# Patient Record
Sex: Female | Born: 1980 | Smoking: Never smoker
Health system: Southern US, Community
[De-identification: ages and names within clinical notes are randomized; demographics above are authoritative.]

## PROBLEM LIST (undated history)

## (undated) DIAGNOSIS — Z803 Family history of malignant neoplasm of breast: Secondary | ICD-10-CM

## (undated) DIAGNOSIS — Z800 Family history of malignant neoplasm of digestive organs: Secondary | ICD-10-CM

## (undated) HISTORY — DX: Family history of malignant neoplasm of digestive organs: Z80.0

## (undated) HISTORY — DX: Family history of malignant neoplasm of breast: Z80.3

---

## 2011-08-04 ENCOUNTER — Other Ambulatory Visit (HOSPITAL_COMMUNITY)
Admission: RE | Admit: 2011-08-04 | Discharge: 2011-08-04 | Disposition: A | Discharge status: Home / Self Care | Payer: BC Managed Care – PPO | Source: Ambulatory Visit | Attending: Obstetrics and Gynecology | Admitting: Obstetrics and Gynecology

## 2011-08-04 DIAGNOSIS — Z01419 Encounter for gynecological examination (general) (routine) without abnormal findings: Secondary | ICD-10-CM | POA: Insufficient documentation

## 2012-08-07 ENCOUNTER — Other Ambulatory Visit (HOSPITAL_COMMUNITY)
Admission: RE | Admit: 2012-08-07 | Discharge: 2012-08-07 | Disposition: A | Discharge status: Home / Self Care | Payer: BC Managed Care – PPO | Source: Ambulatory Visit | Attending: Obstetrics and Gynecology | Admitting: Obstetrics and Gynecology

## 2012-08-07 ENCOUNTER — Other Ambulatory Visit: Payer: Self-pay | Admitting: Obstetrics and Gynecology

## 2012-08-07 DIAGNOSIS — Z01419 Encounter for gynecological examination (general) (routine) without abnormal findings: Secondary | ICD-10-CM | POA: Insufficient documentation

## 2012-08-07 DIAGNOSIS — Z1151 Encounter for screening for human papillomavirus (HPV): Secondary | ICD-10-CM | POA: Insufficient documentation

## 2012-08-16 ENCOUNTER — Emergency Department (INDEPENDENT_AMBULATORY_CARE_PROVIDER_SITE_OTHER)
Admission: EM | Admit: 2012-08-16 | Discharge: 2012-08-16 | Disposition: A | Discharge status: Home / Self Care | Payer: Worker's Compensation | Source: Home / Self Care | Attending: Family Medicine | Admitting: Family Medicine

## 2012-08-16 ENCOUNTER — Encounter (HOSPITAL_COMMUNITY): Payer: Self-pay

## 2012-08-16 DIAGNOSIS — R55 Syncope and collapse: Secondary | ICD-10-CM

## 2012-08-16 LAB — GLUCOSE, CAPILLARY: Glucose-Capillary: 110 mg/dL — ABNORMAL HIGH (ref 70–99)

## 2012-08-16 LAB — POCT I-STAT, CHEM 8
Chloride: 105 mEq/L (ref 96–112)
Creatinine, Ser: 1 mg/dL (ref 0.50–1.10)
Glucose, Bld: 107 mg/dL — ABNORMAL HIGH (ref 70–99)
HCT: 42 % (ref 36.0–46.0)
Hemoglobin: 14.3 g/dL (ref 12.0–15.0)
Potassium: 4.2 mEq/L (ref 3.5–5.1)
Sodium: 141 mEq/L (ref 135–145)

## 2012-08-16 LAB — POCT PREGNANCY, URINE: Preg Test, Ur: NEGATIVE

## 2012-08-16 MED ORDER — MECLIZINE HCL 50 MG PO TABS: 25.0000 mg | ORAL_TABLET | Freq: Three times a day (TID) | ORAL | Status: DC | PRN

## 2012-08-16 NOTE — ED Provider Notes (Signed)
CSN: [id number]     Arrival date & time 08/16/12  1429 History     First MD Initiated Contact with Patient 08/16/12 1459     Chief Complaint  Patient presents with  . Dizziness   (Consider location/radiation/quality/duration/timing/severity/associated sxs/prior Treatment) HPI Comments: 32 year old female with  history of vertigo episodes in the past. Here complaining of an episodes of dizziness associated with nausea vomiting earlier today. 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 He states that she felt hot and dizzy and had to be out of the house and in her car she started feeling nauseous and vomiting. Denies chest pain, syncope or chest pain or palpitations. He states that she's had similar symptoms in the past but associated with sensation of room spinning she did not have this time. She had one episode of loose stools here. Denies abdominal pain or chest pain. When she got here her nausea, dizziness has resolved and she reports feeling well currently. She has had breakfast and lunch today. No history of diabetes or heart disease.    History reviewed. No pertinent past medical history. History reviewed. No pertinent past surgical history. History reviewed. No pertinent family history. History  Substance Use Topics  . Smoking status: Never Smoker   . Smokeless tobacco: Not on file  . Alcohol Use: Yes   OB History   Grav Para Term Preterm Abortions TAB SAB Ect Mult Living                 Review of Systems  Constitutional: Negative for fever, chills, appetite change and fatigue.  HENT: Negative for ear pain, congestion, sore throat and tinnitus.   Eyes: Negative for visual disturbance.  Gastrointestinal: Positive for nausea and vomiting. Negative for abdominal pain.  Genitourinary: Negative for dysuria, flank pain and pelvic pain.  Skin: Negative for rash.  Neurological:  Positive for dizziness. Negative for tremors, seizures, syncope, weakness, numbness and headaches.  All other systems reviewed and are negative.    Allergies  Review of patient's allergies indicates no known allergies.  Home Medications   Current Outpatient Rx  Name  Route  Sig  Dispense  Refill  . meclizine (ANTIVERT) 50 MG tablet   Oral   Take 0.5 tablets (25 mg total) by mouth 3 (three) times daily as needed for dizziness or nausea.   30 tablet   0    BP 112/77  Pulse 89  Temp(Src) 98.6 F (37 C) (Oral)  Resp 16  LMP 08/16/2012 Physical Exam  Nursing note and vitals reviewed. Constitutional: She is oriented to person, place, and time. She appears well-developed and well-nourished. No distress.  HENT:  Head: Normocephalic and atraumatic.  Right Ear: External ear normal.  Left Ear: External ear normal.  Mouth/Throat: Oropharynx is clear and moist. No oropharyngeal exudate.  Eyes: Conjunctivae and EOM are normal. Pupils are equal, round, and reactive to light. No scleral icterus.  No nystagmus  Neck: Normal range of motion. Neck supple. No JVD present. No thyromegaly present.  Cardiovascular: Normal rate, regular rhythm, normal heart sounds and intact distal pulses.  Exam reveals no gallop and no friction rub.   No murmur heard. Pulmonary/Chest: Effort normal and breath sounds normal. No respiratory distress. She has no wheezes. She has no rales. She exhibits no tenderness.  Abdominal: Soft. Bowel sounds are normal. She exhibits no distension and no mass. There is no tenderness.  There is no rebound and no guarding.  Lymphadenopathy:    She has no cervical adenopathy.  Neurological: She is alert and oriented to person, place, and time. She has normal strength. No cranial nerve deficit or sensory deficit. She displays a negative Romberg sign. Coordination and gait normal.  No nystagmus with Dix Hallpike maneuver.   Skin: No rash noted. She is not diaphoretic.    ED Course    Procedures (including critical care time)  Labs Reviewed  GLUCOSE, CAPILLARY - Abnormal; Notable for the following:    Glucose-Capillary 110 (*)    All other components within normal limits  POCT I-STAT, CHEM 8 - Abnormal; Notable for the following:    Glucose, Bld 107 (*)    Calcium, Ion 1.25 (*)    All other components within normal limits  POCT PREGNANCY, URINE   No results found. 1. Vaso vagal episode    EKG: Normal sinus rhythm with ventricular rate of 81 beats per minutes no ST or other acute ischemic changes. Normal axis.  No fascicular or bundle branch blocks. Your nonspecific T wave flattening with no inversion in aVL V2 and V3. No prior EKG for comparison.   MDM  Not orthostatic. No signs of dehydration. Reassuring exam. Asymptomatic here impress had a vasovagal episode vs vertigo.  Prescribed meclizine. Supportive care and red flags that should prompt return to medical attention discussed with patient and provided in writing.   Sharin Grave, MD 08/18/12 2112

## 2012-08-16 NOTE — ED Notes (Signed)
Employee of [employer] ; wears a bullet proof vest under shirt , and while at client home , states she got really hot, and felt she would pass out. Vomited several times. Denies pain, SOB, nausea at present

## 2013-08-08 ENCOUNTER — Other Ambulatory Visit (HOSPITAL_COMMUNITY)
Admission: RE | Admit: 2013-08-08 | Discharge: 2013-08-08 | Disposition: A | Discharge status: Home / Self Care | Payer: BC Managed Care – PPO | Source: Ambulatory Visit | Attending: Obstetrics and Gynecology | Admitting: Obstetrics and Gynecology

## 2013-08-08 ENCOUNTER — Other Ambulatory Visit: Payer: Self-pay | Admitting: Obstetrics and Gynecology

## 2013-08-08 DIAGNOSIS — Z01419 Encounter for gynecological examination (general) (routine) without abnormal findings: Secondary | ICD-10-CM | POA: Insufficient documentation

## 2013-08-09 LAB — CYTOLOGY - PAP

## 2019-01-16 DIAGNOSIS — Z20828 Contact with and (suspected) exposure to other viral communicable diseases: Secondary | ICD-10-CM | POA: Diagnosis not present

## 2019-01-16 DIAGNOSIS — Z6822 Body mass index (BMI) 22.0-22.9, adult: Secondary | ICD-10-CM | POA: Diagnosis not present

## 2019-02-28 ENCOUNTER — Other Ambulatory Visit: Payer: Self-pay

## 2019-02-28 ENCOUNTER — Ambulatory Visit (INDEPENDENT_AMBULATORY_CARE_PROVIDER_SITE_OTHER): Payer: Federal, State, Local not specified - PPO | Admitting: Advanced Practice Midwife

## 2019-02-28 VITALS — BP 110/76 | HR 92 | Ht 66.0 in | Wt 148.0 lb

## 2019-02-28 DIAGNOSIS — Z3169 Encounter for other general counseling and advice on procreation: Secondary | ICD-10-CM

## 2019-02-28 DIAGNOSIS — Z124 Encounter for screening for malignant neoplasm of cervix: Secondary | ICD-10-CM

## 2019-02-28 DIAGNOSIS — N939 Abnormal uterine and vaginal bleeding, unspecified: Secondary | ICD-10-CM

## 2019-02-28 DIAGNOSIS — Z01419 Encounter for gynecological examination (general) (routine) without abnormal findings: Secondary | ICD-10-CM | POA: Diagnosis not present

## 2019-02-28 DIAGNOSIS — Z0000 Encounter for general adult medical examination without abnormal findings: Secondary | ICD-10-CM

## 2019-02-28 MED ORDER — PRENATE MINI 18-0.6-0.4-350 MG PO CAPS: 1.0000 | ORAL_CAPSULE | Freq: Every day | ORAL | 11 refills | Status: DC

## 2019-02-28 NOTE — Progress Notes (Signed)
Subjective:     Mandy Hill is a 39 y.o. female here at [facility] for a routine exam.  Current complaints: Irregular spotting last month.  Personal health questionnaire reviewed: yes.  First degree relative with breast cancer? Yes Do you have a primary care provider? Yes  Depression screen PHQ 2/9 02/28/2019  Decreased Interest 0  Down, Depressed, Hopeless 0  PHQ - 2 Score 0     Gynecologic History No LMP recorded. Contraception: none Last Pap: 2019. Results were: normal Last mammogram: n/a.   Obstetric History OB History  No obstetric history on file.     The following portions of the patient's history were reviewed and updated as appropriate: allergies, current medications, past family history, past medical history, past social history, past surgical history and problem list.  Review of Systems Pertinent items noted in HPI and remainder of comprehensive ROS otherwise negative.    Objective:   BP 110/76   Pulse 92   Ht 5\' 6"  (1.676 m)   Wt 148 lb (67.1 kg)   BMI 23.89 kg/m    VS reviewed, nursing note reviewed,  Constitutional: well developed, well nourished, no distress HEENT: normocephalic, thyroid without enlargement or mass CV: normal rate HEART: normal rate, heart sounds, regular rhythm RESP: normal effort, lung sounds clear and equal bilaterally Breast Exam:  right breast normal without mass, skin or nipple changes or axillary nodes, left breast normal without mass, skin or nipple changes or axillary nodes Abdomen: soft Neuro: alert and oriented x 3 Skin: warm, dry Psych: affect normal Pelvic exam: Deferred     Assessment/Plan:   1. Screening for cervical cancer --Pt had normal Pap in 2019. Discussed options, including Pap today or Pap in 2024 when due.  Will wait and defer Pap today.  2. Encounter for preconception consultation --Pt and her husband desire pregnancy. She has never tried to become pregnant before until this month. She has regular menses  but this month had intermenstrual spotting, was hoping it was implantation spotting but pregnancy tests were negative. --Discussed relative risks of AMA pregnancy, fertility options if pt still trying in 6 months. --Questions answered --Rx for PNV, take daily or take folic acid daily   3. Well woman exam (no gynecological exam)   4. Abnormal uterine bleeding (AUB) --Single episode of metrorrhagia. May have been implantation spotting but pregnancy not achieved or other physiologic cause.  No need for treatment at this time, will follow up if persists.    Follow up in: 6 months or as needed.   2025, CNM 2:43 PM

## 2019-02-28 NOTE — Patient Instructions (Signed)
Preventive Care 21-39 Years Old, Female Preventive care refers to visits with your health care provider and lifestyle choices that can promote health and wellness. This includes:  A yearly physical exam. This may also be called an annual well check.  Regular dental visits and eye exams.  Immunizations.  Screening for certain conditions.  Healthy lifestyle choices, such as eating a healthy diet, getting regular exercise, not using drugs or products that contain nicotine and tobacco, and limiting alcohol use. What can I expect for my preventive care visit? Physical exam Your health care provider will check your:  Height and weight. This may be used to calculate body mass index (BMI), which tells if you are at a healthy weight.  Heart rate and blood pressure.  Skin for abnormal spots. Counseling Your health care provider may ask you questions about your:  Alcohol, tobacco, and drug use.  Emotional well-being.  Home and relationship well-being.  Sexual activity.  Eating habits.  Work and work environment.  Method of birth control.  Menstrual cycle.  Pregnancy history. What immunizations do I need?  Influenza (flu) vaccine  This is recommended every year. Tetanus, diphtheria, and pertussis (Tdap) vaccine  You may need a Td booster every 10 years. Varicella (chickenpox) vaccine  You may need this if you have not been vaccinated. Human papillomavirus (HPV) vaccine  If recommended by your health care provider, you may need three doses over 6 months. Measles, mumps, and rubella (MMR) vaccine  You may need at least one dose of MMR. You may also need a second dose. Meningococcal conjugate (MenACWY) vaccine  One dose is recommended if you are age 19-21 years and a first-year college student living in a residence hall, or if you have one of several medical conditions. You may also need additional booster doses. Pneumococcal conjugate (PCV13) vaccine  You may need  this if you have certain conditions and were not previously vaccinated. Pneumococcal polysaccharide (PPSV23) vaccine  You may need one or two doses if you smoke cigarettes or if you have certain conditions. Hepatitis A vaccine  You may need this if you have certain conditions or if you travel or work in places where you may be exposed to hepatitis A. Hepatitis B vaccine  You may need this if you have certain conditions or if you travel or work in places where you may be exposed to hepatitis B. Haemophilus influenzae type b (Hib) vaccine  You may need this if you have certain conditions. You may receive vaccines as individual doses or as more than one vaccine together in one shot (combination vaccines). Talk with your health care provider about the risks and benefits of combination vaccines. What tests do I need?  Blood tests  Lipid and cholesterol levels. These may be checked every 5 years starting at age 20.  Hepatitis C test.  Hepatitis B test. Screening  Diabetes screening. This is done by checking your blood sugar (glucose) after you have not eaten for a while (fasting).  Sexually transmitted disease (STD) testing.  BRCA-related cancer screening. This may be done if you have a family history of breast, ovarian, tubal, or peritoneal cancers.  Pelvic exam and Pap test. This may be done every 3 years starting at age 21. Starting at age 30, this may be done every 5 years if you have a Pap test in combination with an HPV test. Talk with your health care provider about your test results, treatment options, and if necessary, the need for more tests.   Follow these instructions at home: Eating and drinking   Eat a diet that includes fresh fruits and vegetables, whole grains, lean protein, and low-fat dairy.  Take vitamin and mineral supplements as recommended by your health care provider.  Do not drink alcohol if: ? Your health care provider tells you not to drink. ? You are  pregnant, may be pregnant, or are planning to become pregnant.  If you drink alcohol: ? Limit how much you have to 0-1 drink a day. ? Be aware of how much alcohol is in your drink. In the [place] one drink equals one 12 oz bottle of beer (355 mL), one 5 oz glass of wine (148 mL), or one 1 oz glass of hard liquor (44 mL). Lifestyle  Take daily care of your teeth and gums.  Stay active. Exercise for at least 30 minutes on 5 or more days each week.  Do not use any products that contain nicotine or tobacco, such as cigarettes, e-cigarettes, and chewing tobacco. If you need help quitting, ask your health care provider.  If you are sexually active, practice safe sex. Use a condom or other form of birth control (contraception) in order to prevent pregnancy and STIs (sexually transmitted infections). If you plan to become pregnant, see your health care provider for a preconception visit. What's next?  Visit your health care provider once a year for a well check visit.  Ask your health care provider how often you should have your eyes and teeth checked.  Stay up to date on all vaccines. This information is not intended to replace advice given to you by your health care provider. Make sure you discuss any questions you have with your health care provider. Document Revised: 09/01/2017 Document Reviewed: 09/01/2017 Elsevier Patient Education  2020 [employer].

## 2019-02-28 NOTE — Progress Notes (Signed)
NGYN patient presents for Annual Exam.  Last Pap: 2019 WNL no Hx of abnormal pap.  LMP:02/07/2019 cycles last 3-4 days last cycle was different per pt.  Contraception: None  STD Screening: Declined  Family Hx of Breast Cancer: Yes Mother Dx in 2006.  CC: Pt wants to conceive pt would like prenatal vitamins.

## 2019-09-05 DIAGNOSIS — R110 Nausea: Secondary | ICD-10-CM | POA: Diagnosis not present

## 2019-09-05 DIAGNOSIS — R197 Diarrhea, unspecified: Secondary | ICD-10-CM | POA: Diagnosis not present

## 2019-11-23 DIAGNOSIS — D239 Other benign neoplasm of skin, unspecified: Secondary | ICD-10-CM | POA: Diagnosis not present

## 2019-11-23 DIAGNOSIS — L408 Other psoriasis: Secondary | ICD-10-CM | POA: Diagnosis not present

## 2019-11-23 DIAGNOSIS — L700 Acne vulgaris: Secondary | ICD-10-CM | POA: Diagnosis not present

## 2020-03-10 DIAGNOSIS — Z0000 Encounter for general adult medical examination without abnormal findings: Secondary | ICD-10-CM | POA: Diagnosis not present

## 2020-03-10 DIAGNOSIS — Z13220 Encounter for screening for lipoid disorders: Secondary | ICD-10-CM | POA: Diagnosis not present

## 2020-03-19 DIAGNOSIS — M545 Low back pain, unspecified: Secondary | ICD-10-CM | POA: Diagnosis not present

## 2020-04-01 DIAGNOSIS — K5900 Constipation, unspecified: Secondary | ICD-10-CM | POA: Diagnosis not present

## 2020-04-01 DIAGNOSIS — Z800 Family history of malignant neoplasm of digestive organs: Secondary | ICD-10-CM | POA: Diagnosis not present

## 2020-04-01 DIAGNOSIS — K648 Other hemorrhoids: Secondary | ICD-10-CM | POA: Diagnosis not present

## 2020-04-23 DIAGNOSIS — M9902 Segmental and somatic dysfunction of thoracic region: Secondary | ICD-10-CM | POA: Diagnosis not present

## 2020-04-23 DIAGNOSIS — M9903 Segmental and somatic dysfunction of lumbar region: Secondary | ICD-10-CM | POA: Diagnosis not present

## 2020-04-23 DIAGNOSIS — M9905 Segmental and somatic dysfunction of pelvic region: Secondary | ICD-10-CM | POA: Diagnosis not present

## 2020-04-23 DIAGNOSIS — M9901 Segmental and somatic dysfunction of cervical region: Secondary | ICD-10-CM | POA: Diagnosis not present

## 2020-06-10 DIAGNOSIS — R87612 Low grade squamous intraepithelial lesion on cytologic smear of cervix (LGSIL): Secondary | ICD-10-CM | POA: Diagnosis not present

## 2020-06-10 DIAGNOSIS — Z01419 Encounter for gynecological examination (general) (routine) without abnormal findings: Secondary | ICD-10-CM | POA: Diagnosis not present

## 2020-06-16 ENCOUNTER — Telehealth: Payer: Self-pay | Admitting: Genetic Counselor

## 2020-06-16 NOTE — Telephone Encounter (Signed)
Pt called in to sch appt. Pt aware of updated appt.

## 2020-06-17 ENCOUNTER — Other Ambulatory Visit: Payer: Self-pay

## 2020-06-17 ENCOUNTER — Inpatient Hospital Stay: Payer: Federal, State, Local not specified - PPO

## 2020-06-17 ENCOUNTER — Inpatient Hospital Stay: Payer: Federal, State, Local not specified - PPO | Attending: Hematology | Admitting: Genetic Counselor

## 2020-06-17 DIAGNOSIS — Z1509 Genetic susceptibility to other malignant neoplasm: Secondary | ICD-10-CM

## 2020-06-17 DIAGNOSIS — Z803 Family history of malignant neoplasm of breast: Secondary | ICD-10-CM

## 2020-06-17 DIAGNOSIS — Z800 Family history of malignant neoplasm of digestive organs: Secondary | ICD-10-CM

## 2020-06-17 DIAGNOSIS — Z1501 Genetic susceptibility to malignant neoplasm of breast: Secondary | ICD-10-CM

## 2020-06-19 DIAGNOSIS — Z1211 Encounter for screening for malignant neoplasm of colon: Secondary | ICD-10-CM | POA: Diagnosis not present

## 2020-06-19 DIAGNOSIS — K635 Polyp of colon: Secondary | ICD-10-CM | POA: Diagnosis not present

## 2020-06-19 DIAGNOSIS — Z800 Family history of malignant neoplasm of digestive organs: Secondary | ICD-10-CM | POA: Diagnosis not present

## 2020-06-19 DIAGNOSIS — Q438 Other specified congenital malformations of intestine: Secondary | ICD-10-CM | POA: Diagnosis not present

## 2020-06-25 ENCOUNTER — Encounter: Payer: Self-pay | Admitting: Genetic Counselor

## 2020-06-25 ENCOUNTER — Other Ambulatory Visit: Payer: Self-pay | Admitting: Internal Medicine

## 2020-06-25 DIAGNOSIS — Z800 Family history of malignant neoplasm of digestive organs: Secondary | ICD-10-CM | POA: Insufficient documentation

## 2020-06-25 DIAGNOSIS — Z1231 Encounter for screening mammogram for malignant neoplasm of breast: Secondary | ICD-10-CM

## 2020-06-25 DIAGNOSIS — Z803 Family history of malignant neoplasm of breast: Secondary | ICD-10-CM | POA: Insufficient documentation

## 2020-06-25 NOTE — Progress Notes (Signed)
REFERRING PROVIDER: Ma Hillock, [place] [address] [place],  [place] [address]  PRIMARY PROVIDER:  Seward Carol, MD  PRIMARY REASON FOR VISIT:  1. Family history of breast cancer   2. Family history of colon cancer   3. Family history of throat cancer      HISTORY OF PRESENT ILLNESS:   Mandy Hill, a 40 y.o. female, was seen for a [place] cancer genetics consultation at the request of Burman Riis, NP, due to a family history of cancer.  Mandy Hill presents to clinic today to discuss the possibility of a hereditary predisposition to cancer, genetic testing, and to further clarify her future cancer risks, as well as potential cancer risks for family members.   Mandy Hill does not have a personal history of cancer.    RISK FACTORS:  Menarche was at age 59.  No live births.  OCP use for approximately 3 years.  Ovaries intact: yes.  Hysterectomy: no.  Menopausal status: premenopausal.  HRT use: 0 years. Colonoscopy: yes; planned 06/19/2020. Mammogram within the  last year: n/a. Number of breast biopsies: 0. Up to date with pelvic exams: yes. Any excessive radiation exposure in the past: no  Past Medical History:  Diagnosis Date   Family history of breast cancer    Family history of colon cancer    Family history of throat cancer     No past surgical history on file.  Social History   Socioeconomic History   Marital status: Unknown    Spouse name: Not on file   Number of children: Not on file   Years of education: Not on file   Highest education level: Not on file  Occupational History   Not on file  Tobacco Use   Smoking status: Never   Smokeless tobacco: Not on file  Substance and Sexual Activity   Alcohol use: Yes   Drug use: Not on file   Sexual activity: Yes    Birth control/protection: I.U.D.  Other Topics Concern   Not on file  Social History Narrative   Not on file   Social Determinants of Health   Financial Resource  Strain: Not on file  Food Insecurity: Not on file  Transportation Needs: Not on file  Physical Activity: Not on file  Stress: Not on file  Social Connections: Not on file     FAMILY HISTORY:  We obtained a detailed, 4-generation family history.  Significant diagnoses are listed below: Family History  Problem Relation Age of Onset   Breast cancer Mother 51   Colon cancer Father 3   Throat cancer Paternal Grandfather        dx 51s, hx of smoking   Breast cancer Other        dx 9s, paternal great-aunt   Breast cancer Other        dx 24s, paternal great-aunt   Throat cancer Other 40       paternal great-uncle   Throat cancer Other        dx 33s, paternal great-uncle   Breast cancer Other 70       paternal first cousin once removed   Mandy Hill does not have children. She has one sister (age 85) who has not had cancer.   Mandy Hill mother is alive at age 64 and has a history of breast cancer (diagnosed age 81). There were 12 maternal aunts and 3 maternal uncles. There is no known cancer among maternal aunts/uncles or maternal  cousins. Mandy Hill maternal grandmother died at age 26 without cancer. Her maternal grandfather died in his 45s without cancer.  Mandy Hill father is alive at age 88 and has a history of colon cancer (diagnosed age 6). There is one paternal uncle who has not had cancer. There is no known cancer among paternal first cousins. Mandy Hill paternal grandmother is alive at age 45 without cancer. Her paternal grandfather died at age 59 with throat cancer (diagnosed in his 92s) and had a history of smoking. Two paternal great-aunts had breast cancer, both diagnosed in their 60s. Two paternal great-uncles had throat cancer, one diagnosed at age 80 and the other diagnosed in his 7s. A paternal first cousin once removed had breast cancer diagnosed at age 102.  Mandy Hill is unaware of previous family history of genetic testing for hereditary cancer risks. Patient's  maternal ancestors are of Native American descent, and paternal ancestors are of unknown descent. There is no reported Ashkenazi Jewish ancestry. There is no known consanguinity.  GENETIC COUNSELING ASSESSMENT: Mandy Hill is a 40 y.o. female with a family history of breast cancer, colon cancer, and throat cancer which is not strongly suggestive of a hereditary cancer syndrome. We, therefore, discussed and recommended the following at today's visit.   DISCUSSION:  We discussed that, in general, most cancer is not inherited in families, but instead is sporadic or familial. Sporadic cancers occur by chance and typically happen at older ages (>50 years) as this type of cancer is caused by genetic changes acquired during an individual's lifetime. Some families have more cancers than would be expected by chance; however, the ages or types of cancer are not consistent with a known genetic mutation or known genetic mutations have been ruled out. This type of familial cancer is thought to be due to a combination of multiple genetic, environmental, hormonal, and lifestyle factors. While this combination of factors likely increases the risk of cancer, the exact source of this risk is not currently identifiable or testable.    We discussed that approximately 5-10% of cancer is hereditary, meaning that it is due to a mutation in a single gene that is passed down from generation to generation in a family. Most hereditary cases of breast cancer are associated with BRCA1 and BRCA2 genes, whereas most hereditary cases of colorectal cancer are associated with Lynch syndrome. There are other genes that can be associated an increased risk for breast and/or colorectal cancer, including ATM, CHEK2, PALB2, APC, MUTYH, etc. We discussed that testing can be beneficial for several reasons, including knowing about other cancer risks, identifying potential screening and risk-reduction options that may be appropriate, and to understand if  other family members could be at risk for cancer and allow them to undergo genetic testing.   We reviewed the characteristics, features and inheritance patterns of hereditary cancer syndromes. We discussed with Ms. Lampkins that the family history does not meet insurance or NCCN criteria for genetic testing and, therefore, is not highly consistent with a familial hereditary cancer syndrome.  We feel she is at low risk to harbor a gene mutation associated with such a condition. Thus, we did not recommend any genetic testing, at this time, and recommended Ms. Moren continue to follow the cancer screening guidelines given by her primary healthcare provider. We discussed that if Ms. Hogan still wishes to pursue genetic testing, the out of pocket cost would be $250.   Based on the patient's personal and family history, a statistical model (  Tyrer-Cuzick) was used to estimate her risk of developing breast cancer. Tyrer-Cuzick estimates her lifetime risk of developing breast cancer to be approximately 14.8%. This estimation does not consider any genetic testing results. The patient's lifetime breast cancer risk is a preliminary estimate based on available information using one of several models endorsed by the [facility] (ACS). The ACS recommends consideration of breast MRI screening as an adjunct to mammography for patients at high risk (defined as 20% or greater lifetime risk). We discussed that this risk estimate can change over time and that this calculation may be repeated to reflect new information in her personal or family history in the future.     PLAN: Genetic testing was not recommended at today's visit. We remain available to coordinate genetic testing at any time in the future. We, therefore, recommend Ms. Emanuele continue to follow the cancer screening guidelines given by her primary healthcare provider.  Based on Ms. Lansdowne's family history, we recommended her paternal first cousin once  removed, who was diagnosed with breast cancer at age 20, have genetic counseling and testing. Ms. Servidio will let us know if we can be of any assistance in coordinating genetic counseling and/or testing for this family member.   Lastly, we encouraged Ms. Culliton to remain in contact with cancer genetics annually so that we can continuously update the family history and inform her of any changes in cancer genetics and testing that may be of benefit for this family.   Ms. Esguerra questions were answered to her satisfaction today. Our contact information was provided should additional questions or concerns arise. Thank you for the referral and allowing [place] to share in the care of your patient.   Clint Guy, [place], [facility] Licensed, Certified Dispensing [email or web address] Phone: [phone]  The patient was seen for a total of 35 minutes in face-to-face genetic counseling. Patient was seen alone. This patient was discussed with Drs. Magrinat, Lindi Adie and/or Burr Medico who agrees with the above.    _______________________________________________________________________ For Office Staff:  Number of people involved in session: 1 Was an Intern/ student involved with case: no

## 2020-07-18 DIAGNOSIS — Z3202 Encounter for pregnancy test, result negative: Secondary | ICD-10-CM | POA: Diagnosis not present

## 2020-07-18 DIAGNOSIS — R87612 Low grade squamous intraepithelial lesion on cytologic smear of cervix (LGSIL): Secondary | ICD-10-CM | POA: Diagnosis not present

## 2020-07-18 DIAGNOSIS — N72 Inflammatory disease of cervix uteri: Secondary | ICD-10-CM | POA: Diagnosis not present

## 2020-08-19 ENCOUNTER — Ambulatory Visit
Admission: RE | Admit: 2020-08-19 | Discharge: 2020-08-19 | Disposition: A | Discharge status: Home / Self Care | Payer: Federal, State, Local not specified - PPO | Source: Ambulatory Visit | Attending: Internal Medicine | Admitting: Internal Medicine

## 2020-08-19 ENCOUNTER — Other Ambulatory Visit: Payer: Self-pay

## 2020-08-19 DIAGNOSIS — Z1231 Encounter for screening mammogram for malignant neoplasm of breast: Secondary | ICD-10-CM | POA: Diagnosis not present

## 2020-11-02 DIAGNOSIS — J069 Acute upper respiratory infection, unspecified: Secondary | ICD-10-CM | POA: Diagnosis not present

## 2020-11-02 DIAGNOSIS — R059 Cough, unspecified: Secondary | ICD-10-CM | POA: Diagnosis not present

## 2020-11-04 ENCOUNTER — Telehealth: Payer: Federal, State, Local not specified - PPO | Admitting: Physician Assistant

## 2020-11-04 DIAGNOSIS — J208 Acute bronchitis due to other specified organisms: Secondary | ICD-10-CM | POA: Diagnosis not present

## 2020-11-04 DIAGNOSIS — B9689 Other specified bacterial agents as the cause of diseases classified elsewhere: Secondary | ICD-10-CM | POA: Diagnosis not present

## 2020-11-04 MED ORDER — PSEUDOEPH-BROMPHEN-DM 30-2-10 MG/5ML PO SYRP: 5.0000 mL | ORAL_SOLUTION | Freq: Four times a day (QID) | ORAL | 0 refills | Status: DC | PRN

## 2020-11-04 MED ORDER — IPRATROPIUM BROMIDE 0.03 % NA SOLN: 2.0000 | Freq: Two times a day (BID) | NASAL | 12 refills | Status: DC

## 2020-11-04 MED ORDER — BENZONATATE 100 MG PO CAPS: 100.0000 mg | ORAL_CAPSULE | Freq: Three times a day (TID) | ORAL | 0 refills | Status: DC | PRN

## 2020-11-04 MED ORDER — AMOXICILLIN-POT CLAVULANATE 875-125 MG PO TABS: 1.0000 | ORAL_TABLET | Freq: Two times a day (BID) | ORAL | 0 refills | Status: DC

## 2020-11-04 NOTE — Patient Instructions (Signed)
Rob Bunting, thank you for joining Mandy Loveless, PA-C for today's virtual visit.  While this provider is not your primary care provider (PCP), if your PCP is located in our provider database this encounter information will be shared with them immediately following your visit.  Consent: (Patient) Mandy Hill provided verbal consent for this virtual visit at the beginning of the encounter.  Current Medications:  Current Outpatient Medications:    amoxicillin-clavulanate (AUGMENTIN) 875-125 MG tablet, Take 1 tablet by mouth 2 (two) times daily., Disp: 20 tablet, Rfl: 0   benzonatate (TESSALON) 100 MG capsule, Take 1 capsule (100 mg total) by mouth 3 (three) times daily as needed., Disp: 30 capsule, Rfl: 0   brompheniramine-pseudoephedrine-DM 30-2-10 MG/5ML syrup, Take 5 mLs by mouth 4 (four) times daily as needed., Disp: 120 mL, Rfl: 0   ipratropium (ATROVENT) 0.03 % nasal spray, Place 2 sprays into both nostrils every 12 (twelve) hours., Disp: 30 mL, Rfl: 12   meclizine (ANTIVERT) 50 MG tablet, Take 0.5 tablets (25 mg total) by mouth 3 (three) times daily as needed for dizziness or nausea. (Patient not taking: Reported on 02/28/2019), Disp: 30 tablet, Rfl: 0   Prenat-FeCbn-FeAsp-Meth-FA-DHA (PRENATE MINI) 18-0.6-0.4-350 MG CAPS, Take 1 capsule by mouth daily., Disp: 30 capsule, Rfl: 11   Medications ordered in this encounter:  Meds ordered this encounter  Medications   amoxicillin-clavulanate (AUGMENTIN) 875-125 MG tablet    Sig: Take 1 tablet by mouth 2 (two) times daily.    Dispense:  20 tablet    Refill:  0    Order Specific Question:   Supervising Provider    Answer:   MILLER, BRIAN [[id number]]   ipratropium (ATROVENT) 0.03 % nasal spray    Sig: Place 2 sprays into both nostrils every 12 (twelve) hours.    Dispense:  30 mL    Refill:  12    Order Specific Question:   Supervising Provider    Answer:   Eber Hong [[id number]]   brompheniramine-pseudoephedrine-DM 30-2-10 MG/5ML syrup     Sig: Take 5 mLs by mouth 4 (four) times daily as needed.    Dispense:  120 mL    Refill:  0    Order Specific Question:   Supervising Provider    Answer:   MILLER, BRIAN [[id number]]   benzonatate (TESSALON) 100 MG capsule    Sig: Take 1 capsule (100 mg total) by mouth 3 (three) times daily as needed.    Dispense:  30 capsule    Refill:  0    Order Specific Question:   Supervising Provider    Answer:   Mandy Hill, BRIAN [[id number]]     *If you need refills on other medications prior to your next appointment, please contact your pharmacy*  Follow-Up: Call back or seek an in-person evaluation if the symptoms worsen or if the condition fails to improve as anticipated.  Other Instructions Acute Bronchitis, Adult Acute bronchitis is sudden or acute swelling of the air tubes (bronchi) in the lungs. Acute bronchitis causes these tubes to fill with mucus, which can make it hard to breathe. It can also cause coughing or wheezing. In adults, acute bronchitis usually goes away within 2 weeks. A cough caused by bronchitis may last up to 3 weeks. Smoking, allergies, and asthma can make the condition worse. What are the causes? This condition can be caused by germs and by substances that irritate the lungs, including: Cold and flu viruses. The most common cause of this condition is the  virus that causes the common cold. Bacteria. Substances that irritate the lungs, including: Smoke from cigarettes and other forms of tobacco. Dust and pollen. Fumes from chemical products, gases, or burned fuel. Other materials that pollute indoor or outdoor air. Close contact with someone who has acute bronchitis. What increases the risk? The following factors may make you more likely to develop this condition: A weak body's defense system, also called the immune system. A condition that affects your lungs and breathing, such as asthma. What are the signs or symptoms? Common symptoms of this condition include: Lung and  breathing problems, such as: Coughing. This may bring up clear, yellow, or green mucus from your lungs (sputum). Wheezing. Having too much mucus in your lungs (chest congestion). Having shortness of breath. A fever. Chills. Aches and pains, including: Tightness in your chest and other body aches. A sore throat. How is this diagnosed? This condition is usually diagnosed based on: Your symptoms and medical history. A physical exam. You may also have other tests, including tests to rule out other conditions, such as pneumonia. These tests include: A test of lung function. Test of a mucus sample to look for the presence of bacteria. Tests to check the oxygen level in your blood. Blood tests. Chest X-ray. How is this treated? Most cases of acute bronchitis clear up over time without treatment. Your health care provider may recommend: Drinking more fluids. This can thin your mucus, which may improve your breathing. Using a device that gets medicine into your lungs (inhaler) to help improve breathing and control coughing. Using a vaporizer or a humidifier. These are machines that add water to the air to help you breathe better. Taking a medicine for a fever. Taking a medicine that thins mucus and clears congestion (expectorant). Taking a medicine that prevents or stops coughing (cough suppressant). Follow these instructions at home: Activity Get plenty of rest. Return to your normal activities as told by your health care provider. Ask your health care provider what activities are safe for you. Lifestyle  Drink enough fluid to keep your urine pale yellow. Do not drink alcohol. Do not use any products that contain nicotine or tobacco, such as cigarettes, e-cigarettes, and chewing tobacco. If you need help quitting, ask your health care provider. Be aware that: Your bronchitis will get worse if you smoke or breathe in other people's smoke (secondhand smoke). Your lungs will heal faster if  you quit smoking. General instructions Take over-the-counter and prescription medicines only as told by your health care provider. Use an inhaler, vaporizer, or humidifier as told by your health care provider. If you have a sore throat, gargle with a salt-water mixture 3-4 times a day or as needed. To make a salt-water mixture, completely dissolve -1 tsp (3-6 g) of salt in 1 cup (237 mL) of warm water. Take two teaspoons of honey at bedtime to lessen coughing at night. Keep all follow-up visits as told by your health care provider. This is important. How is this prevented? To lower your risk of getting this condition again: Wash your hands often with soap and water. If soap and water are not available, use hand sanitizer. Avoid contact with people who have cold symptoms. Try not to touch your mouth, nose, or eyes with your hands. Avoid places where there are fumes from chemicals. Breathing these fumes will make your condition worse. Get the flu shot every year. Contact a health care provider if: Your symptoms do not improve after 2 weeks of  treatment. You vomit more than once or twice. You have symptoms of dehydration such as: Dark urine. Dry skin or eyes. Increased thirst. Headaches. Confusion. Muscle cramps. Get help right away if you: Cough up blood. Feel pain in your chest. Have severe shortness of breath. Faint or keep feeling like you are going to faint. Have a severe headache. Have fever or chills that get worse. These symptoms may represent a serious problem that is an emergency. Do not wait to see if the symptoms will go away. Get medical help right away. Call your local emergency services (911 in the [place] Do not drive yourself to the hospital. Summary Acute bronchitis is sudden (acute) inflammation of the air tubes (bronchi) between the windpipe and the lungs. In adults, acute bronchitis usually goes away within 2 weeks, although coughing may last 3 weeks or  longer. Take over-the-counter and prescription medicines only as told by your health care provider. Drink enough fluid to keep your urine pale yellow. Contact a health care provider if your symptoms do not improve after 2 weeks of treatment. Get help right away if you cough up blood, faint, or have chest pain or shortness of breath. This information is not intended to replace advice given to you by your health care provider. Make sure you discuss any questions you have with your health care provider. Document Revised: 11/21/2019 Document Reviewed: 07/14/2018 Elsevier Patient Education  2022 [employer].    If you have been instructed to have an in-person evaluation today at a local Urgent Care facility, please use the link below. It will take you to a list of all of our available [facility], including address, phone number and hours of operation. Please do not delay care.  [facility]  If you or a family member do not have a primary care provider, use the link below to schedule a visit and establish care. When you choose a [facility] primary care physician or advanced practice provider, you gain a long-term partner in health. Find a Primary Care Provider  Learn more about [facility] in-office and virtual care options: [facility] - Get Care Now

## 2020-11-04 NOTE — Progress Notes (Signed)
Virtual Visit Consent   Mandy Hill, you are scheduled for a virtual visit with a [facility] provider today.     Just as with appointments in the office, your consent must be obtained to participate.  Your consent will be active for this visit and any virtual visit you may have with one of our providers in the next 365 days.     If you have a MyChart account, a copy of this consent can be sent to you electronically.  All virtual visits are billed to your insurance company just like a traditional visit in the office.    As this is a virtual visit, video technology does not allow for your provider to perform a traditional examination.  This may limit your provider's ability to fully assess your condition.  If your provider identifies any concerns that need to be evaluated in person or the need to arrange testing (such as labs, EKG, etc.), we will make arrangements to do so.     Although advances in technology are sophisticated, we cannot ensure that it will always work on either your end or our end.  If the connection with a video visit is poor, the visit may have to be switched to a telephone visit.  With either a video or telephone visit, we are not always able to ensure that we have a secure connection.     I need to obtain your verbal consent now.   Are you willing to proceed with your visit today?    Mandy Hill has provided verbal consent on 11/04/2020 for a virtual visit (video or telephone).   [name], PA-C   Date: 11/04/2020 4:25 PM   Virtual Visit via Video Note   I, [name], connected with  Mandy Hill  ([id number], 1980-03-17) on 11/04/20 at  4:15 PM EDT by a video-enabled telemedicine application and verified that I am speaking with the correct person using two identifiers.  Location: Patient: Virtual Visit Location Patient: Home Provider: Virtual Visit Location Provider: Home Office   I discussed the limitations of evaluation and management by  telemedicine and the availability of in person appointments. The patient expressed understanding and agreed to proceed.    History of Present Illness: Mandy Hill is a 40 y.o. who identifies as a female who was assigned female at birth, and is being seen today for congestion and cough.  HPI: URI  This is a new problem. The current episode started in the past 7 days (Thursday last week). The problem has been gradually worsening. There has been no fever. Associated symptoms include congestion, headaches, nausea, rhinorrhea, sinus pain, a sore throat and vomiting (from coughing). Treatments tried: zpak. The treatment provided no relief.   Went to [facility] on Sunday, 11/02/20. Was sent to UC for covid testing and further evaluation. Zpak started. No improvements, some symptoms worsening.  Covid testing rapid is negative, done at [facility]. Awaiting flu and covid PCR test results.  Problems:  Patient Active Problem List   Diagnosis Date Noted   Family history of breast cancer 06/25/2020   Family history of colon cancer 06/25/2020   Family history of throat cancer 06/25/2020    Allergies: No Known Allergies Medications:  Current Outpatient Medications:    amoxicillin-clavulanate (AUGMENTIN) 875-125 MG tablet, Take 1 tablet by mouth 2 (two) times daily., Disp: 20 tablet, Rfl: 0   benzonatate (TESSALON) 100 MG capsule, Take 1 capsule (100 mg total) by mouth 3 (three)  times daily as needed., Disp: 30 capsule, Rfl: 0   brompheniramine-pseudoephedrine-DM 30-2-10 MG/5ML syrup, Take 5 mLs by mouth 4 (four) times daily as needed., Disp: 120 mL, Rfl: 0   ipratropium (ATROVENT) 0.03 % nasal spray, Place 2 sprays into both nostrils every 12 (twelve) hours., Disp: 30 mL, Rfl: 12   meclizine (ANTIVERT) 50 MG tablet, Take 0.5 tablets (25 mg total) by mouth 3 (three) times daily as needed for dizziness or nausea. (Patient not taking: Reported on 02/28/2019), Disp: 30 tablet, Rfl: 0    Prenat-FeCbn-FeAsp-Meth-FA-DHA (PRENATE MINI) 18-0.6-0.4-350 MG CAPS, Take 1 capsule by mouth daily., Disp: 30 capsule, Rfl: 11  Observations/Objective: Patient is well-developed, well-nourished in no acute distress.  Resting comfortably at home.  Head is normocephalic, atraumatic.  No labored breathing.  Speech is clear and coherent with logical content.  Patient is alert and oriented at baseline.  Dry cough heard a few times  Assessment and Plan: 1. Acute bacterial bronchitis - amoxicillin-clavulanate (AUGMENTIN) 875-125 MG tablet; Take 1 tablet by mouth 2 (two) times daily.  Dispense: 20 tablet; Refill: 0 - ipratropium (ATROVENT) 0.03 % nasal spray; Place 2 sprays into both nostrils every 12 (twelve) hours.  Dispense: 30 mL; Refill: 12 - brompheniramine-pseudoephedrine-DM 30-2-10 MG/5ML syrup; Take 5 mLs by mouth 4 (four) times daily as needed.  Dispense: 120 mL; Refill: 0 - benzonatate (TESSALON) 100 MG capsule; Take 1 capsule (100 mg total) by mouth 3 (three) times daily as needed.  Dispense: 30 capsule; Refill: 0  -Worsening despite Zpak - Will treat with augmentin, ipratropium nasal spray, Bromfed DMand tessalon perles.  - Push fluids.  - Rest.  - Call or seek in person evaluation if worsening.   Follow Up Instructions: I discussed the assessment and treatment plan with the patient. The patient was provided an opportunity to ask questions and all were answered. The patient agreed with the plan and demonstrated an understanding of the instructions.  A copy of instructions were sent to the patient via MyChart unless otherwise noted below.    The patient was advised to call back or seek an in-person evaluation if the symptoms worsen or if the condition fails to improve as anticipated.  Time:  I spent 11 minutes with the patient via telehealth technology discussing the above problems/concerns.    [name], PA-C

## 2020-11-05 DIAGNOSIS — Z03818 Encounter for observation for suspected exposure to other biological agents ruled out: Secondary | ICD-10-CM | POA: Diagnosis not present

## 2020-11-05 DIAGNOSIS — B338 Other specified viral diseases: Secondary | ICD-10-CM | POA: Diagnosis not present

## 2020-11-05 DIAGNOSIS — R051 Acute cough: Secondary | ICD-10-CM | POA: Diagnosis not present

## 2020-12-20 DIAGNOSIS — Z6825 Body mass index (BMI) 25.0-25.9, adult: Secondary | ICD-10-CM | POA: Diagnosis not present

## 2020-12-20 DIAGNOSIS — J302 Other seasonal allergic rhinitis: Secondary | ICD-10-CM | POA: Diagnosis not present

## 2020-12-20 DIAGNOSIS — R42 Dizziness and giddiness: Secondary | ICD-10-CM | POA: Diagnosis not present

## 2020-12-20 DIAGNOSIS — N914 Secondary oligomenorrhea: Secondary | ICD-10-CM | POA: Diagnosis not present

## 2021-07-16 ENCOUNTER — Other Ambulatory Visit: Payer: Self-pay | Admitting: Internal Medicine

## 2021-07-16 DIAGNOSIS — Z1231 Encounter for screening mammogram for malignant neoplasm of breast: Secondary | ICD-10-CM

## 2021-08-04 ENCOUNTER — Other Ambulatory Visit: Payer: Self-pay | Admitting: Nurse Practitioner

## 2021-08-04 ENCOUNTER — Other Ambulatory Visit (HOSPITAL_COMMUNITY)
Admission: RE | Admit: 2021-08-04 | Discharge: 2021-08-04 | Disposition: A | Discharge status: Home / Self Care | Payer: No Typology Code available for payment source | Source: Ambulatory Visit | Attending: Nurse Practitioner | Admitting: Nurse Practitioner

## 2021-08-04 DIAGNOSIS — Z124 Encounter for screening for malignant neoplasm of cervix: Secondary | ICD-10-CM | POA: Diagnosis not present

## 2021-08-05 LAB — CYTOLOGY - PAP
Comment: NEGATIVE
Diagnosis: NEGATIVE
High risk HPV: NEGATIVE

## 2021-08-20 ENCOUNTER — Ambulatory Visit
Admission: RE | Admit: 2021-08-20 | Discharge: 2021-08-20 | Disposition: A | Discharge status: Home / Self Care | Payer: No Typology Code available for payment source | Source: Ambulatory Visit | Attending: Internal Medicine | Admitting: Internal Medicine

## 2021-08-20 DIAGNOSIS — Z1231 Encounter for screening mammogram for malignant neoplasm of breast: Secondary | ICD-10-CM

## 2022-03-11 IMAGING — MG MM DIGITAL SCREENING BILAT W/ TOMO AND CAD
8 series · 9 of 24 positions shown · non-contrast
Comparison: None.

CLINICAL DATA: Screening.

EXAM:
DIGITAL SCREENING BILATERAL MAMMOGRAM WITH TOMOSYNTHESIS AND CAD
TECHNIQUE: Bilateral screening digital craniocaudal and mediolateral oblique
mammograms were obtained. Bilateral screening digital breast
tomosynthesis was performed. The images were evaluated with
computer-aided detection.

[R CC synth-2D]
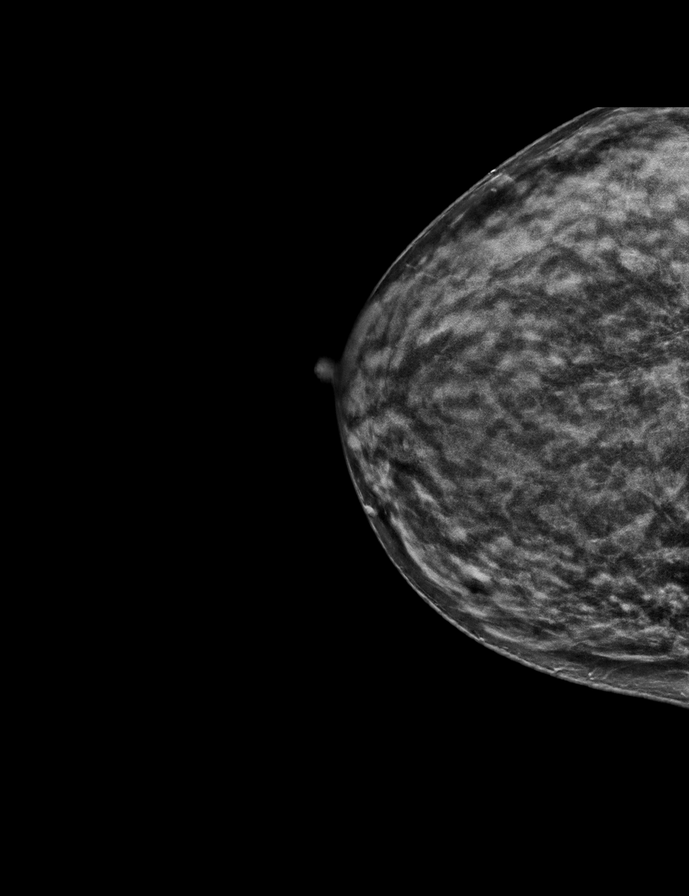

[R MLO synth-2D]
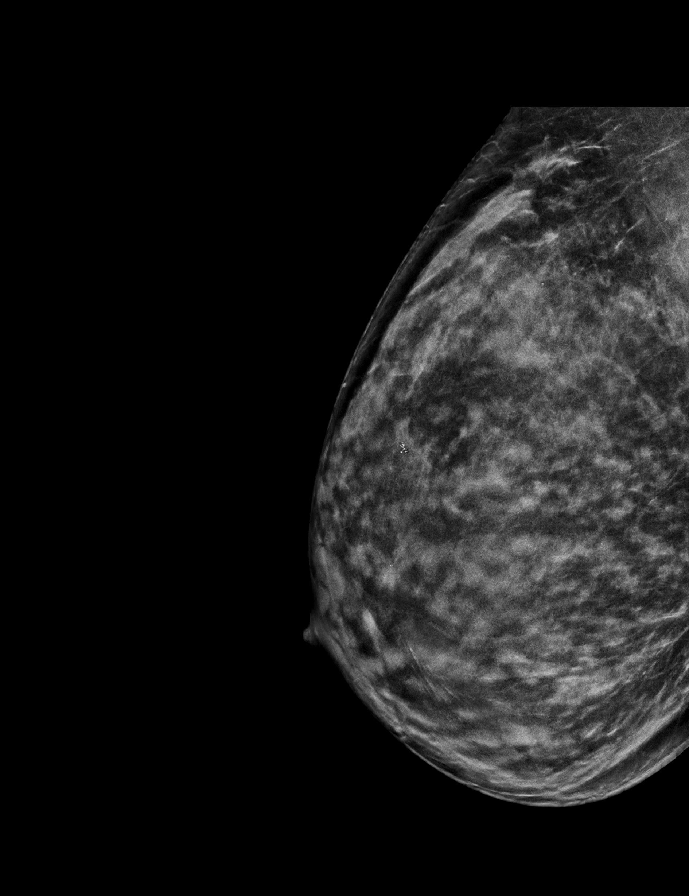

[L MLO synth-2D]
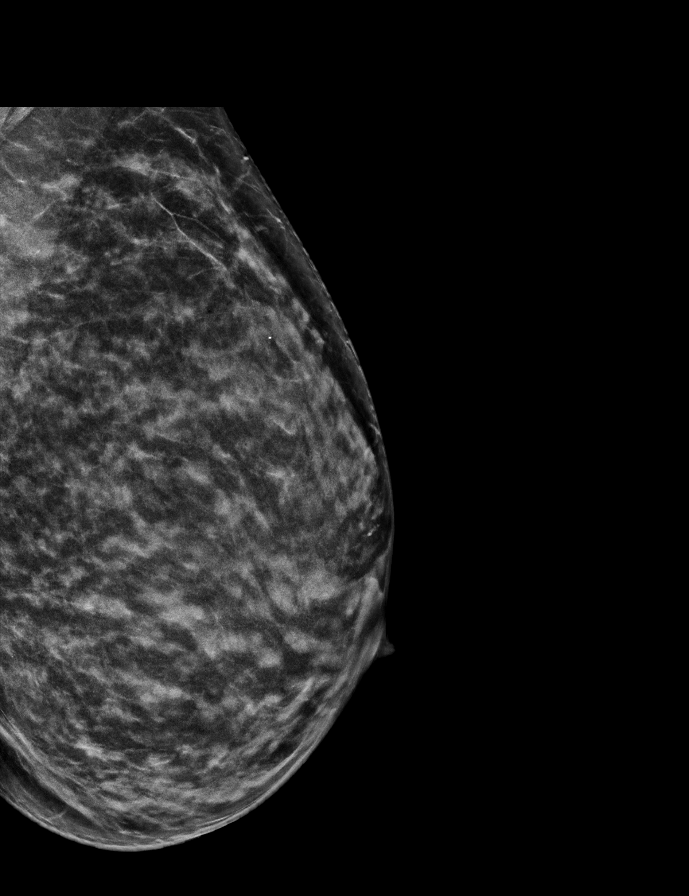

[L CC synth-2D]
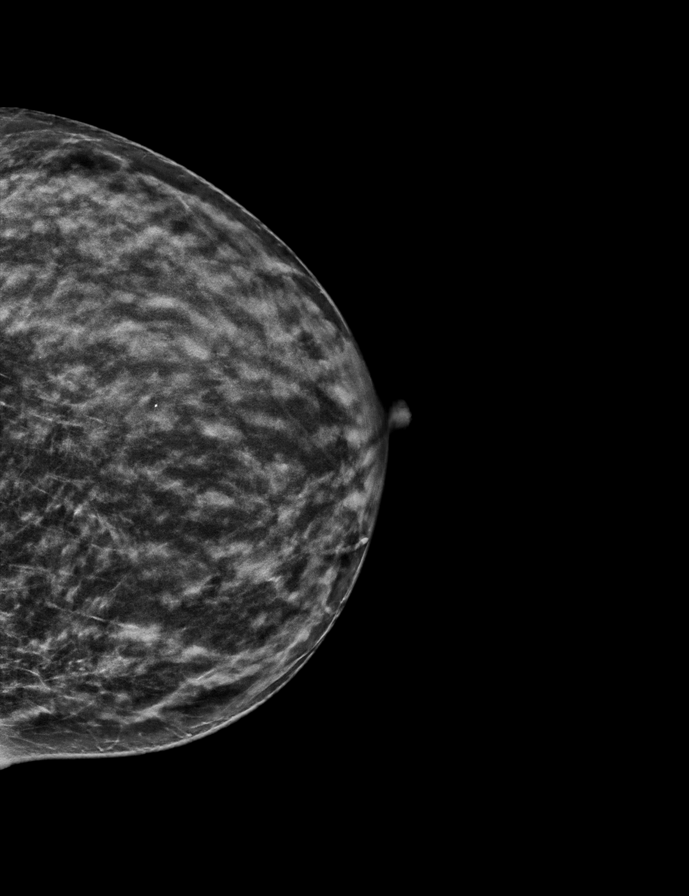

[R CC tomo · 2 of 52 frames shown]
[frame 17/52]
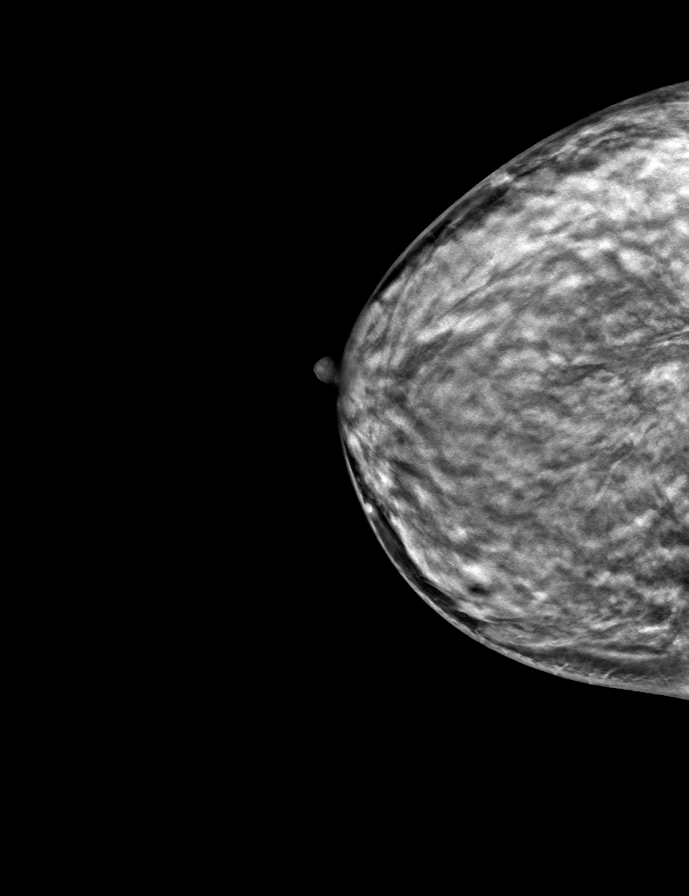
[frame 27/52]
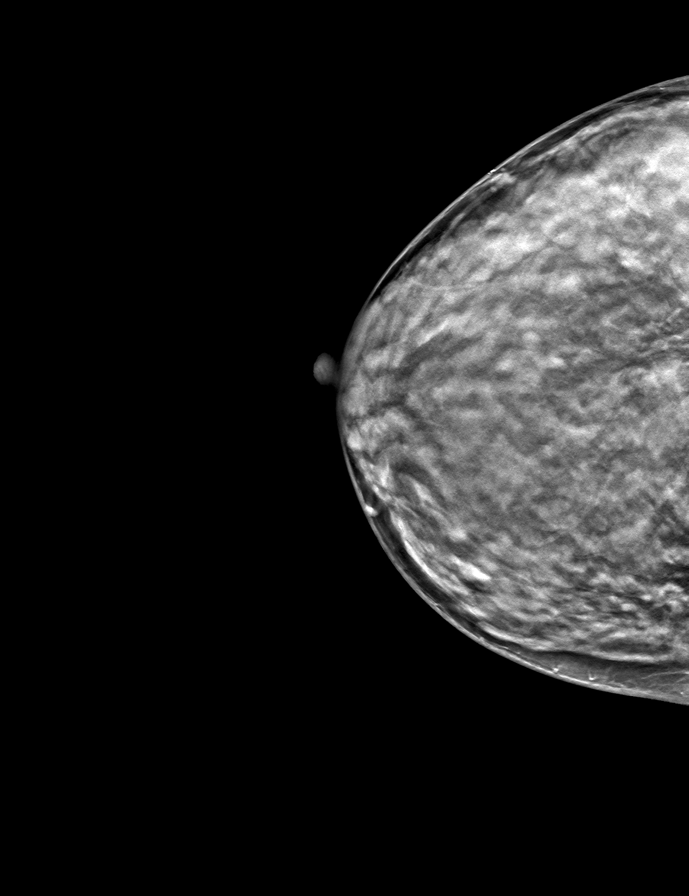

[L MLO tomo · tomo slice 28/55.0]
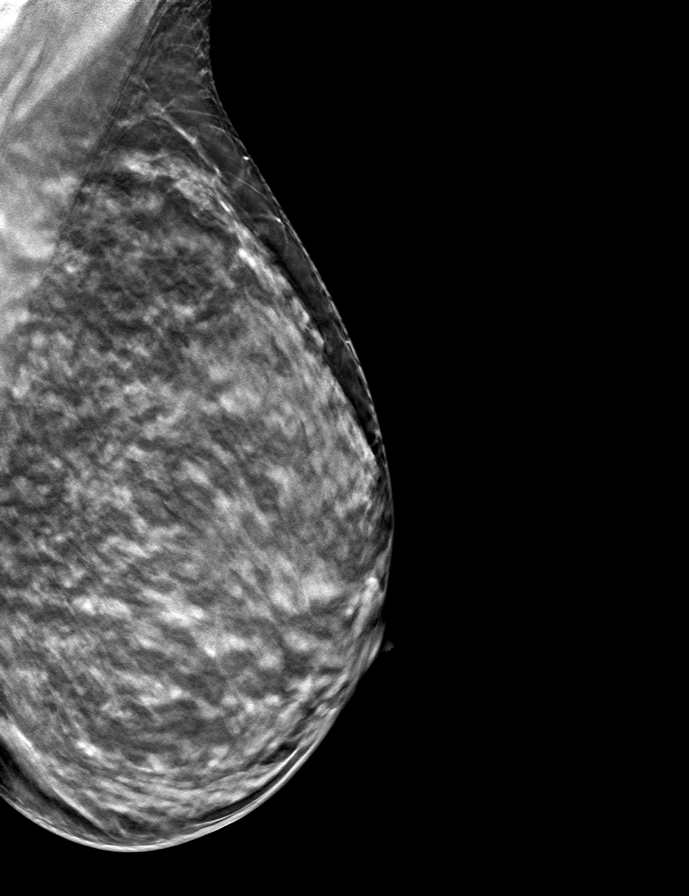

[L CC tomo · tomo slice 27/54.0]
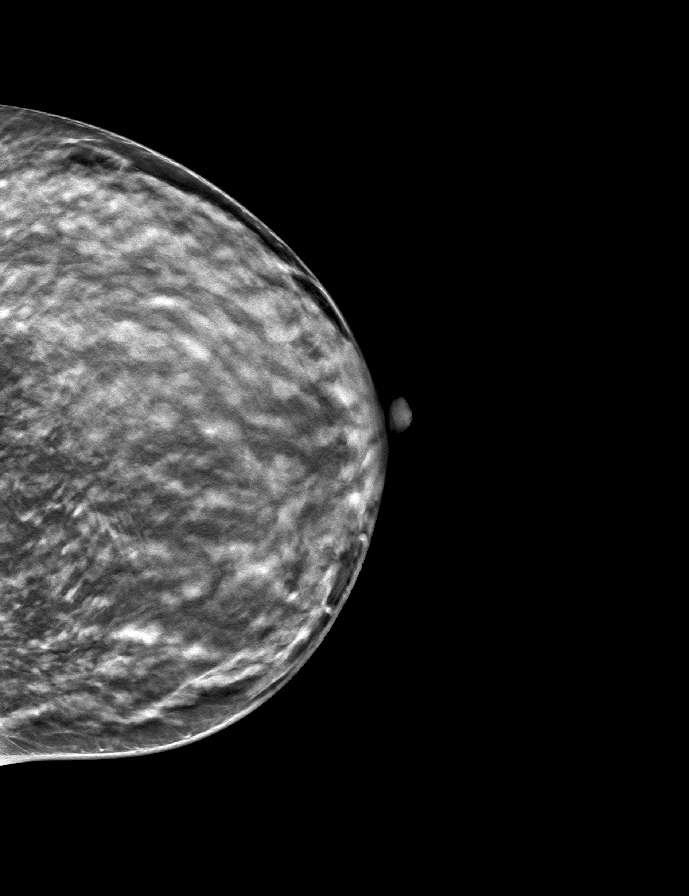

[R MLO tomo · tomo slice 29/57.0]
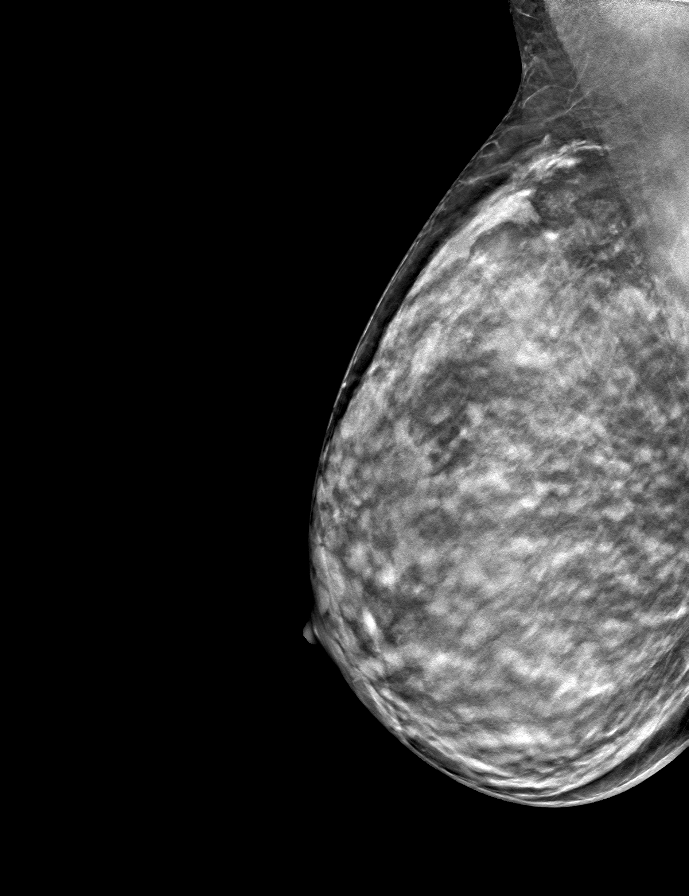

[9 of 24 positions shown; findings below may reference images not displayed]

ACR Breast Density Category d: The breast tissue is extremely dense,
which lowers the sensitivity of mammography.
FINDINGS: There are no findings suspicious for malignancy.
IMPRESSION: No mammographic evidence of malignancy. A result letter of this
screening mammogram will be mailed directly to the patient.

RECOMMENDATION:
Screening mammogram in one year. (Code:W3-Z-XIN)

BI-RADS CATEGORY  1: Negative.

## 2022-07-20 ENCOUNTER — Other Ambulatory Visit: Payer: Self-pay | Admitting: Internal Medicine

## 2022-07-20 DIAGNOSIS — Z0000 Encounter for general adult medical examination without abnormal findings: Secondary | ICD-10-CM

## 2022-08-23 ENCOUNTER — Ambulatory Visit
Admission: RE | Admit: 2022-08-23 | Discharge: 2022-08-23 | Disposition: A | Discharge status: Home / Self Care | Payer: No Typology Code available for payment source | Source: Ambulatory Visit | Attending: Internal Medicine | Admitting: Internal Medicine

## 2022-08-23 DIAGNOSIS — Z0000 Encounter for general adult medical examination without abnormal findings: Secondary | ICD-10-CM

## 2023-01-30 ENCOUNTER — Telehealth: Payer: Commercial Managed Care - PPO | Admitting: Family

## 2023-01-30 DIAGNOSIS — H65193 Other acute nonsuppurative otitis media, bilateral: Secondary | ICD-10-CM | POA: Diagnosis not present

## 2023-01-30 MED ORDER — CIPROFLOXACIN-DEXAMETHASONE 0.3-0.1 % OT SUSP: 4.0000 [drp] | Freq: Two times a day (BID) | OTIC | 0 refills | Status: AC

## 2023-01-30 MED ORDER — AMOXICILLIN-POT CLAVULANATE 875-125 MG PO TABS: 1.0000 | ORAL_TABLET | Freq: Two times a day (BID) | ORAL | 0 refills | Status: AC

## 2023-01-30 NOTE — Progress Notes (Signed)
Virtual Visit Consent   NYSA SARIN, you are scheduled for a virtual visit with a [facility] provider today. Just as with appointments in the office, your consent must be obtained to participate. Your consent will be active for this visit and any virtual visit you may have with one of our providers in the next 365 days. If you have a MyChart account, a copy of this consent can be sent to you electronically.  As this is a virtual visit, video technology does not allow for your provider to perform a traditional examination. This may limit your provider's ability to fully assess your condition. If your provider identifies any concerns that need to be evaluated in person or the need to arrange testing (such as labs, EKG, etc.), we will make arrangements to do so. Although advances in technology are sophisticated, we cannot ensure that it will always work on either your end or our end. If the connection with a video visit is poor, the visit may have to be switched to a telephone visit. With either a video or telephone visit, we are not always able to ensure that we have a secure connection.  By engaging in this virtual visit, you consent to the provision of healthcare and authorize for your insurance to be billed (if applicable) for the services provided during this visit. Depending on your insurance coverage, you may receive a charge related to this service.  I need to obtain your verbal consent now. Are you willing to proceed with your visit today? Mandy Hill has provided verbal consent on 01/30/2023 for a virtual visit (video or telephone). Mandy Rodney, FNP  Date: 01/30/2023 10:35 AM  Virtual Visit via Video Note   I, Mandy Hill, connected with  Mandy Hill  ([id number], Nov 05, 1980) on 01/30/23 at 10:30 AM EST by a video-enabled telemedicine application and verified that I am speaking with the correct person using two identifiers.  Location: Patient: Virtual Visit Location Patient: Other:  car Provider: Virtual Visit Location Provider: Home Office   I discussed the limitations of evaluation and management by telemedicine and the availability of in person appointments. The patient expressed understanding and agreed to proceed.    History of Present Illness: Mandy Hill is a 43 y.o. who identifies as a female who was assigned female at birth, and is being seen today for bilateral ear pain that started 5 days ago. Reports it started in right ear and then her left ear started hurting 3 days ago.  HPI: Otalgia  There is pain in both ears. This is a new problem. The current episode started in the past 7 days. The problem occurs constantly. The problem has been gradually worsening. There has been no fever. The pain is at a severity of 8/10. The pain is moderate. Associated symptoms include hearing loss. Pertinent negatives include no coughing, diarrhea, ear discharge, headaches, rhinorrhea or sore throat. Associated symptoms comments: congestion. She has tried acetaminophen and ear drops for the symptoms. The treatment provided mild relief.    Problems:  Patient Active Problem List   Diagnosis Date Noted   Family history of breast cancer 06/25/2020   Family history of colon cancer 06/25/2020   Family history of throat cancer 06/25/2020    Allergies: No Known Allergies Medications:  Current Outpatient Medications:    amoxicillin-clavulanate (AUGMENTIN) 875-125 MG tablet, Take 1 tablet by mouth 2 (two) times daily., Disp: 14 tablet, Rfl: 0   ciprofloxacin-dexamethasone (CIPRODEX) OTIC suspension, Place 4 drops into  both ears 2 (two) times daily., Disp: 7.5 mL, Rfl: 0  Observations/Objective: Patient is well-developed, well-nourished in no acute distress.  Resting comfortably  in car Head is normocephalic, atraumatic.  No labored breathing.  Speech is clear and coherent with logical content.  Patient is alert and oriented at baseline.  Pain in bilateral ears when moving ear lobe    Assessment and Plan: 1. Other non-recurrent acute nonsuppurative otitis media of both ears (Primary) - amoxicillin-clavulanate (AUGMENTIN) 875-125 MG tablet; Take 1 tablet by mouth 2 (two) times daily.  Dispense: 14 tablet; Refill: 0 - ciprofloxacin-dexamethasone (CIPRODEX) OTIC suspension; Place 4 drops into both ears 2 (two) times daily.  Dispense: 7.5 mL; Refill: 0  Rest Tylenol  Force fluids Zyrtec and Flonase  Follow up if symptoms worsen or do not improve   Follow Up Instructions: I discussed the assessment and treatment plan with the patient. The patient was provided an opportunity to ask questions and all were answered. The patient agreed with the plan and demonstrated an understanding of the instructions.  A copy of instructions were sent to the patient via MyChart unless otherwise noted below.     The patient was advised to call back or seek an in-person evaluation if the symptoms worsen or if the condition fails to improve as anticipated.    Mandy Rodney, FNP

## 2023-01-30 NOTE — Patient Instructions (Signed)

## 2023-07-18 ENCOUNTER — Other Ambulatory Visit: Payer: Self-pay | Admitting: Internal Medicine

## 2023-07-18 DIAGNOSIS — Z1231 Encounter for screening mammogram for malignant neoplasm of breast: Secondary | ICD-10-CM

## 2023-08-02 ENCOUNTER — Other Ambulatory Visit (HOSPITAL_BASED_OUTPATIENT_CLINIC_OR_DEPARTMENT_OTHER): Payer: Self-pay | Admitting: Internal Medicine

## 2023-08-02 ENCOUNTER — Ambulatory Visit (HOSPITAL_BASED_OUTPATIENT_CLINIC_OR_DEPARTMENT_OTHER)
Admission: RE | Admit: 2023-08-02 | Discharge: 2023-08-02 | Disposition: A | Discharge status: Home / Self Care | Source: Ambulatory Visit | Attending: Internal Medicine | Admitting: Internal Medicine

## 2023-08-02 DIAGNOSIS — R200 Anesthesia of skin: Secondary | ICD-10-CM | POA: Diagnosis present

## 2023-08-29 ENCOUNTER — Ambulatory Visit
Admission: RE | Admit: 2023-08-29 | Discharge: 2023-08-29 | Disposition: A | Discharge status: Home / Self Care | Source: Ambulatory Visit | Attending: Internal Medicine | Admitting: Internal Medicine

## 2023-08-29 DIAGNOSIS — Z1231 Encounter for screening mammogram for malignant neoplasm of breast: Secondary | ICD-10-CM
